# Patient Record
Sex: Female | Born: 1955 | Race: White | Hispanic: No | Marital: Married | Smoking: Never smoker
Health system: Southern US, Community
[De-identification: ages and names within clinical notes are randomized; demographics above are authoritative.]

---

## 1997-07-17 ENCOUNTER — Other Ambulatory Visit: Admission: RE | Admit: 1997-07-17 | Discharge: 1997-07-17 | Payer: Self-pay | Admitting: Obstetrics and Gynecology

## 2000-11-04 ENCOUNTER — Other Ambulatory Visit: Admission: RE | Admit: 2000-11-04 | Discharge: 2000-11-04 | Payer: Self-pay | Admitting: Family Medicine

## 2011-11-26 ENCOUNTER — Emergency Department (HOSPITAL_COMMUNITY)
Admission: EM | Admit: 2011-11-26 | Discharge: 2011-11-26 | Disposition: A | Discharge status: Home / Self Care | Payer: Self-pay | Attending: Emergency Medicine | Admitting: Emergency Medicine

## 2011-11-26 ENCOUNTER — Emergency Department (HOSPITAL_COMMUNITY): Payer: Self-pay

## 2011-11-26 DIAGNOSIS — M79643 Pain in unspecified hand: Secondary | ICD-10-CM

## 2011-11-26 DIAGNOSIS — M79609 Pain in unspecified limb: Secondary | ICD-10-CM | POA: Insufficient documentation

## 2011-11-26 DIAGNOSIS — Z885 Allergy status to narcotic agent status: Secondary | ICD-10-CM | POA: Insufficient documentation

## 2011-11-26 MED ORDER — IBUPROFEN 600 MG PO TABS: 600.0000 mg | ORAL_TABLET | Freq: Four times a day (QID) | ORAL | Status: AC | PRN

## 2011-11-26 NOTE — ED Notes (Addendum)
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

## 2011-11-26 NOTE — ED Provider Notes (Signed)
Medical screening examination/treatment/procedure(s) were performed by non-physician practitioner and as supervising physician I was immediately available for consultation/collaboration.   Charles B. Bernette Mayers, MD 11/26/11 1622

## 2011-11-26 NOTE — ED Provider Notes (Signed)
History     CSN: [id number]  Arrival date & time 11/26/11  1404   First MD Initiated Contact with Patient 11/26/11 1548      Chief Complaint  Patient presents with  . Hand Injury    (Consider location/radiation/quality/duration/timing/severity/associated sxs/prior treatment) HPI Comments: D17-year-old female presents to the emergency department with continuing right hand pain after slamming her hand into a car door 3 months ago. States she thought the pain which is go away with time, but anytime she uses her hand her pain comes back. 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 Describes the pain as throbbing with occasional sharp sensations. Denies any numbness or tingling in her hand. Denies any decreased range of motion. She takes one 200 mg ibuprofen in the morning which is no longer helping. She has not iced. Denies any other injury to her right hand. Denies any swelling, wounds, or color change.  Patient is a 56 y.o. female presenting with hand injury. The history is provided by the patient.  Hand Injury     No past medical history on file.  No past surgical history on file.  No family history on file.  History  Substance Use Topics  . Smoking status: Not on file  . Smokeless tobacco: Not on file  . Alcohol Use: Not on file    OB History    No data available      Review of Systems  Musculoskeletal: Positive for arthralgias. Negative for joint swelling.  Skin: Negative for color change and wound.  Neurological: Negative for weakness and numbness.    Allergies  Codeine  Home Medications   Current Outpatient Rx  Name Route Sig Dispense Refill  . B COMPLEX-C PO TABS Oral Take 1 tablet by mouth daily.    . IBUPROFEN 200 MG PO TABS Oral Take 200 mg by mouth every 8 (eight) hours as needed. For pain.    . ST JOHNS WORT PO Oral Take 2 tablets by mouth daily.    Marland Kitchen VITAMIN B-12 1000 MCG PO  TABS Oral Take 1,000 mcg by mouth daily.      There were no vitals taken for this visit.  Physical Exam  Nursing note and vitals reviewed. Constitutional: She is oriented to person, place, and time. She appears well-developed and well-nourished. No distress.  HENT:  Head: Normocephalic and atraumatic.  Eyes: Conjunctivae are normal.  Neck: Normal range of motion. Neck supple.  Cardiovascular: Normal rate, regular rhythm and normal heart sounds.   Pulses:      Radial pulses are 2+ on the right side, and 2+ on the left side.  Pulmonary/Chest: Effort normal and breath sounds normal.  Musculoskeletal:       Right wrist: Normal.       Right hand: She exhibits tenderness (first mcp joint). She exhibits normal range of motion, normal capillary refill, no deformity, no laceration and no swelling. normal sensation noted. Normal strength noted.  Neurological: She is alert and oriented to person, place, and time. She has normal strength. No sensory deficit.  Skin: Skin is warm, dry and intact. No bruising, no ecchymosis and no laceration noted. No cyanosis or erythema.  Psychiatric: She has a normal mood and affect. Her behavior is normal.    ED Course  Procedures (including critical care time)  Labs Reviewed - No data to display Dg Hand Complete Right  11/26/2011  *RADIOLOGY  REPORT*  Clinical Data: Pain, injury  RIGHT HAND - COMPLETE 3+ VIEW  Comparison: None.  Findings: Diffuse osteopenia noted.  Mild degenerative changes of the right first [facility] joint at the wrist.  Normal alignment.  No fracture.  IMPRESSION: No acute osseous finding.   Original Report Authenticated By: Judie Petit. Ruel Favors, M.D.      1. Hand pain       MDM  56 year old female with continuing right hand pain for the past 3 months after slamming it in a car door. No acute findings seen on x-ray. Patient's hand is neurovascularly intact with normal strength and range of motion. Discussed a flareup of her arthritis. Thighs use  of splint, ice, ibuprofen 600 mg every 6 hours as needed. Will give resource list for followup.        Trevor Mace, PA-C 11/26/11 1621

## 2013-09-04 IMAGING — CR DG HAND COMPLETE 3+V*R*
3 series · 3 of 3 positions shown · non-contrast
Comparison: None.

CLINICAL DATA: Pain, injury

RIGHT HAND - COMPLETE 3+ VIEW

[x hand pa right]
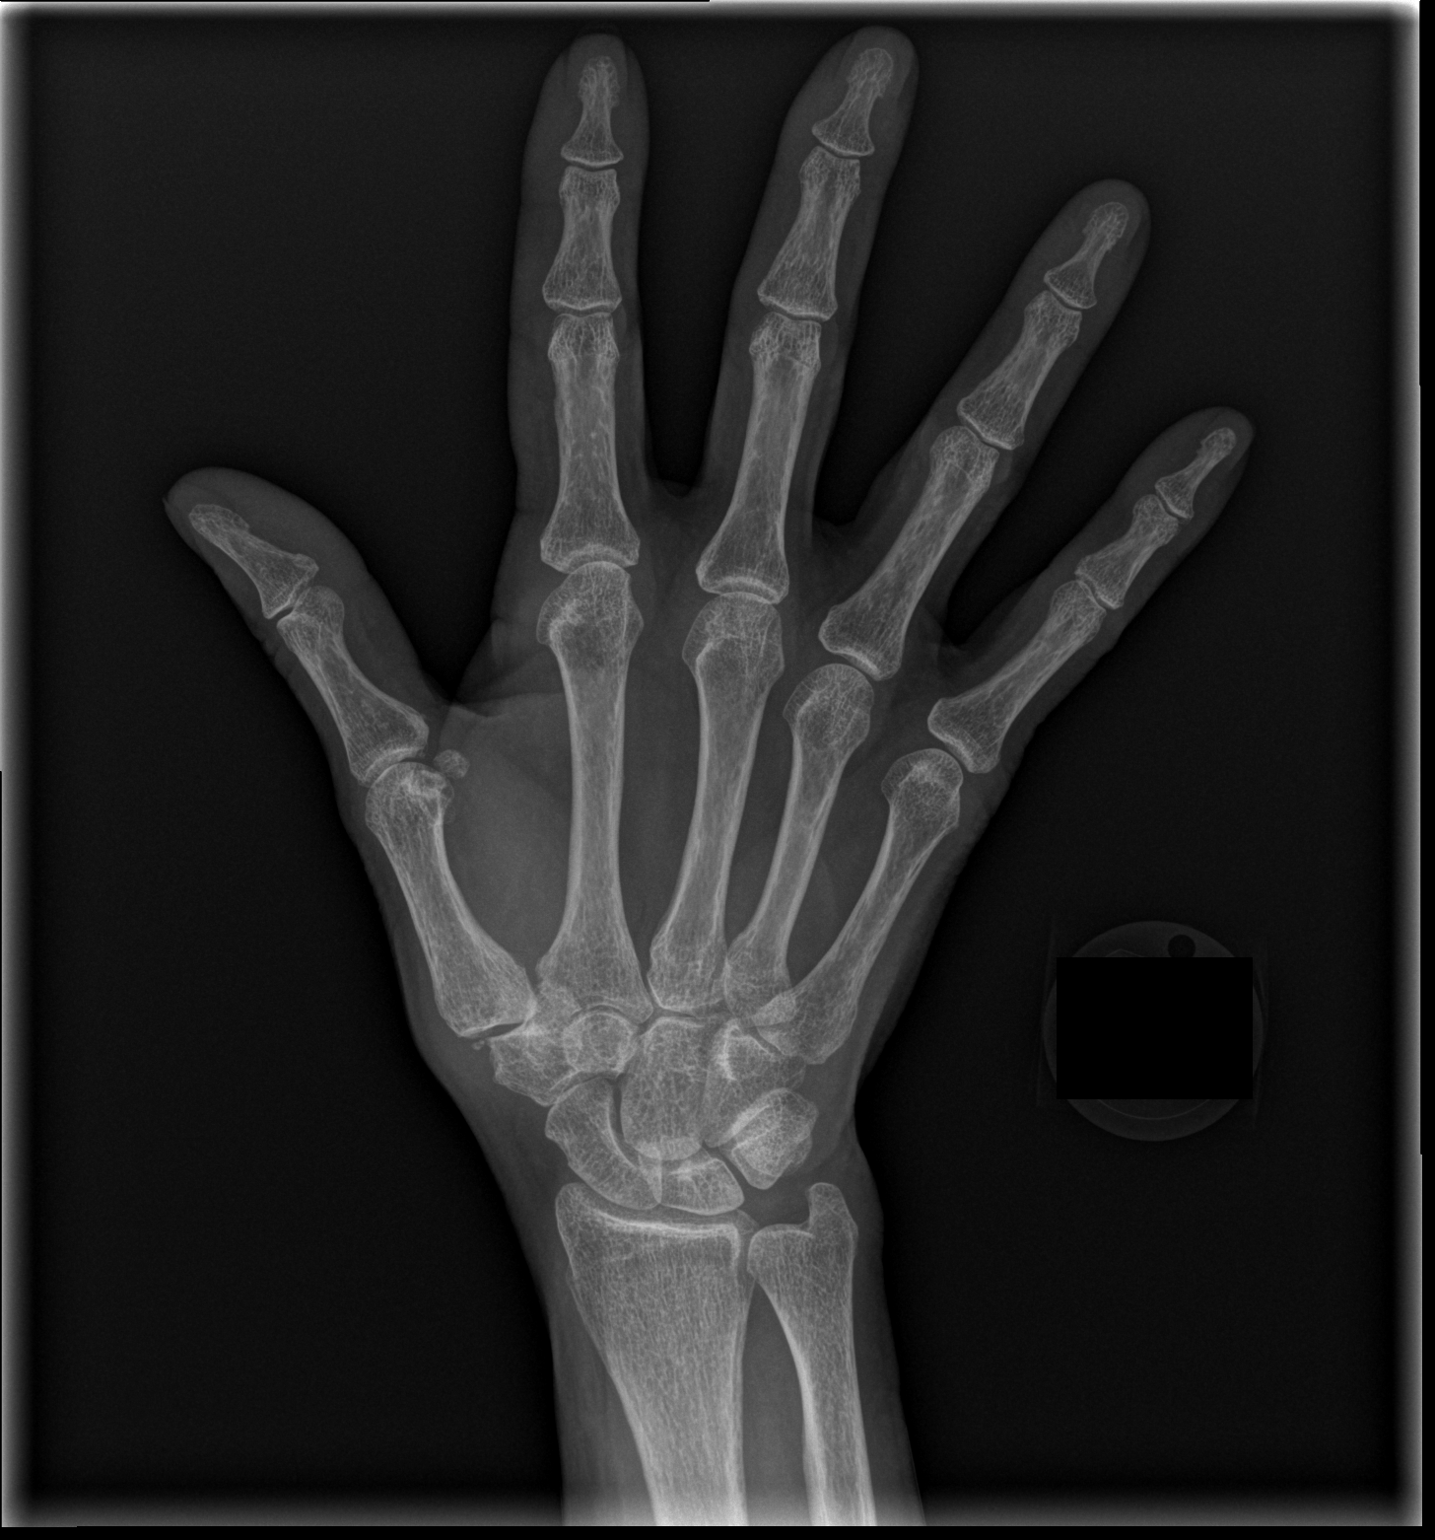

[x hand lat right (1 of 2)]
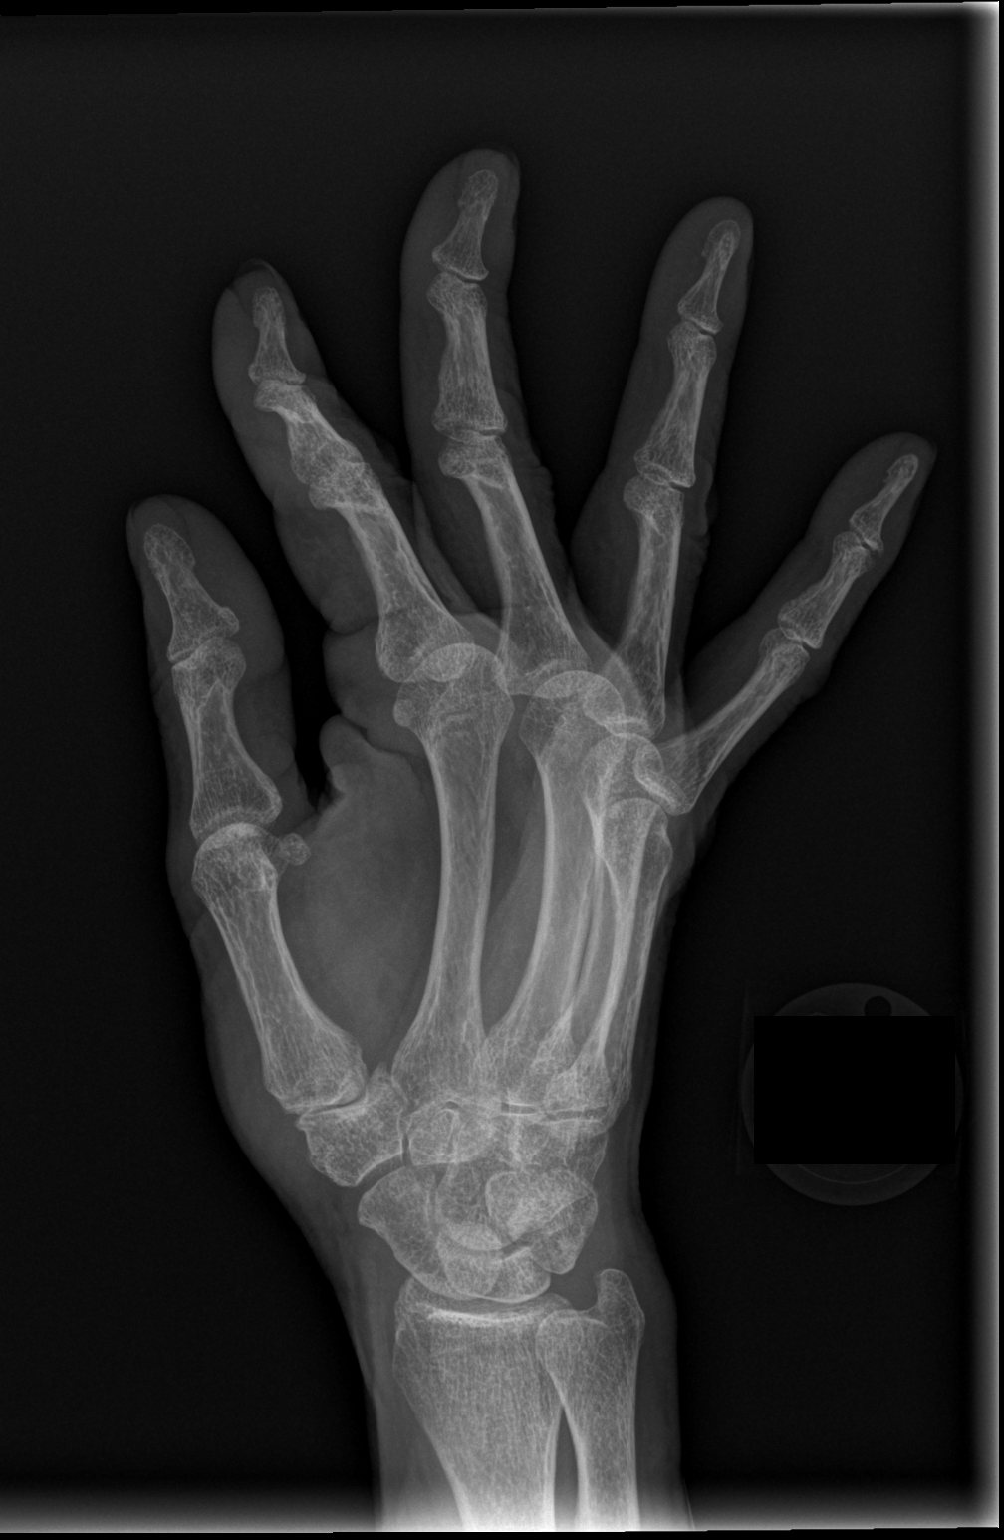

[x hand lat right (2 of 2)]
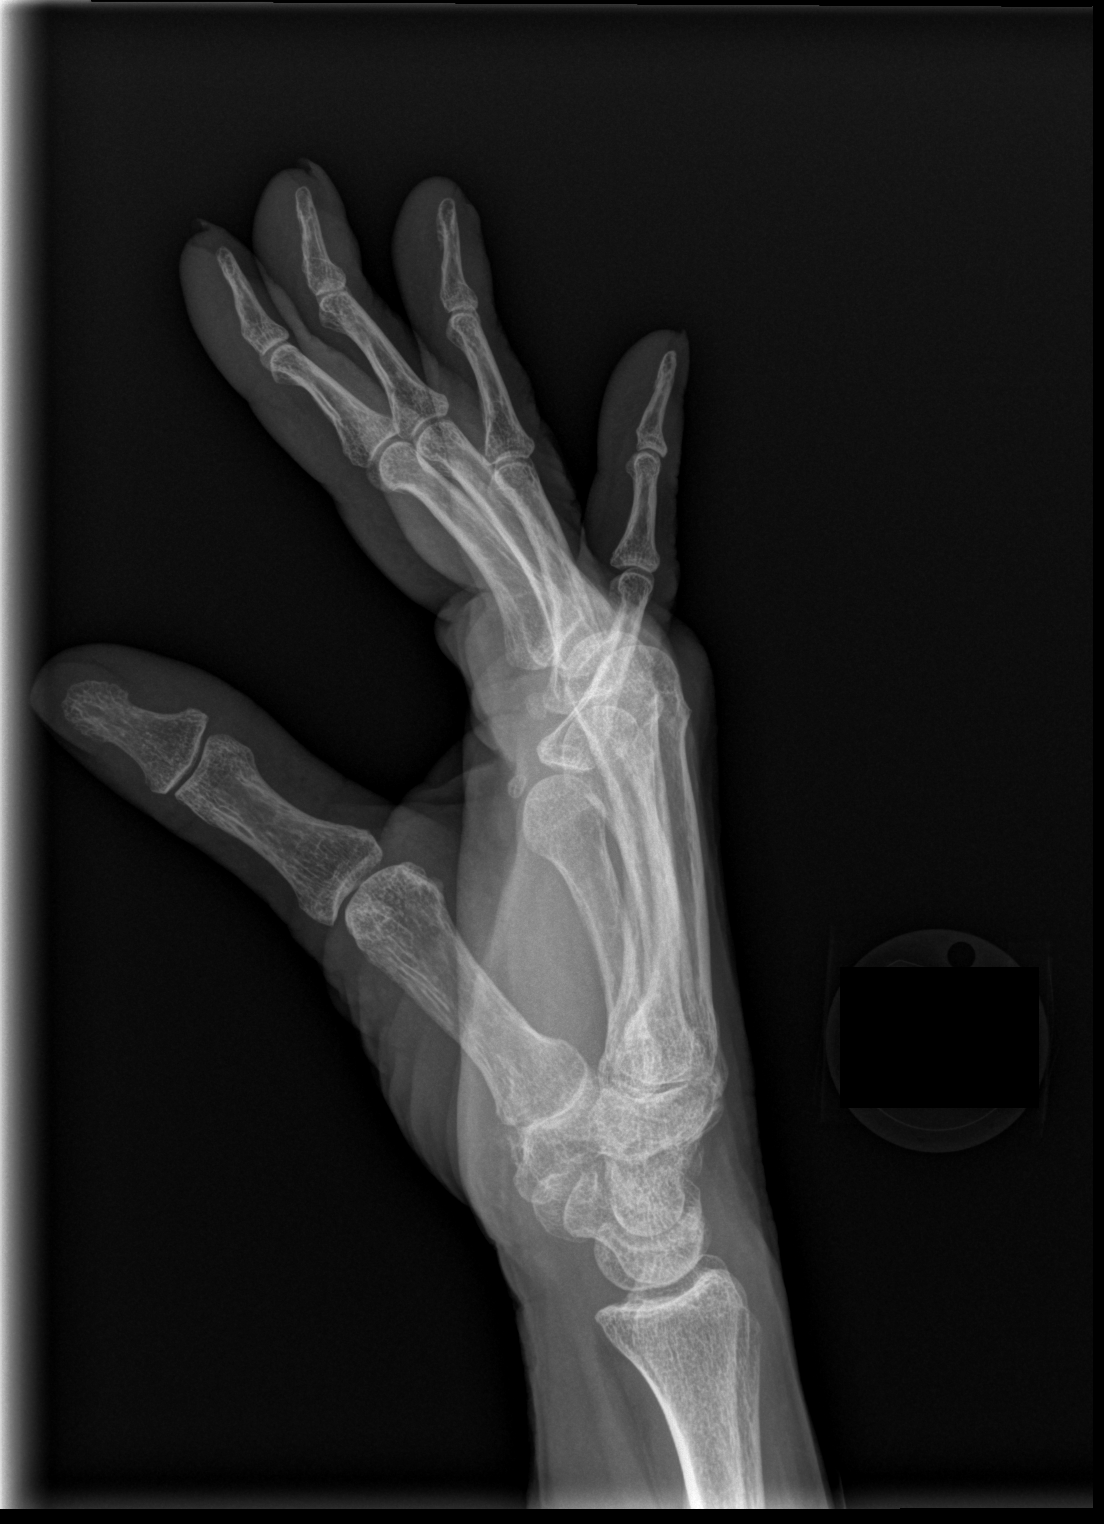

[3 of 3 positions shown; findings below may reference images not displayed]

FINDINGS: Diffuse osteopenia noted.  Mild degenerative changes of
the right first [facility] joint at the wrist.  Normal alignment.  No
fracture.
IMPRESSION: No acute osseous finding.

## 2014-07-08 ENCOUNTER — Emergency Department (HOSPITAL_COMMUNITY)
Admission: EM | Admit: 2014-07-08 | Discharge: 2014-07-08 | Disposition: A | Discharge status: Home / Self Care | Payer: Self-pay | Attending: Emergency Medicine | Admitting: Emergency Medicine

## 2014-07-08 ENCOUNTER — Encounter (HOSPITAL_COMMUNITY): Payer: Self-pay | Admitting: Emergency Medicine

## 2014-07-08 DIAGNOSIS — T6591XA Toxic effect of unspecified substance, accidental (unintentional), initial encounter: Secondary | ICD-10-CM

## 2014-07-08 DIAGNOSIS — Z79899 Other long term (current) drug therapy: Secondary | ICD-10-CM | POA: Insufficient documentation

## 2014-07-08 DIAGNOSIS — Y9389 Activity, other specified: Secondary | ICD-10-CM | POA: Insufficient documentation

## 2014-07-08 DIAGNOSIS — Y990 Civilian activity done for income or pay: Secondary | ICD-10-CM | POA: Insufficient documentation

## 2014-07-08 DIAGNOSIS — X58XXXA Exposure to other specified factors, initial encounter: Secondary | ICD-10-CM | POA: Insufficient documentation

## 2014-07-08 DIAGNOSIS — Y9289 Other specified places as the place of occurrence of the external cause: Secondary | ICD-10-CM | POA: Insufficient documentation

## 2014-07-08 DIAGNOSIS — Z7982 Long term (current) use of aspirin: Secondary | ICD-10-CM | POA: Insufficient documentation

## 2014-07-08 DIAGNOSIS — T550X1A Toxic effect of soaps, accidental (unintentional), initial encounter: Secondary | ICD-10-CM | POA: Insufficient documentation

## 2014-07-08 NOTE — ED Notes (Signed)
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 Pt sts that while taking care of her patient she set her drink down. When she came back to her drink pt sts "there was another woman there." Pt began to drink her drink and noticed it tasted "funny." Pt sts that her throat started to go numb. Pt poured our her drink and noticed a thick blue slime in the bottom of it, pt sts "It looked like dish detergent, but I don't use the blue stuff and neither does my patient."  Pt A&Ox4, sts she was nauseous at first but her only remaining symptom at this time is throat numbness. Pt speaking in complete sentences. Denies SOB, chest pain, N/V, Diarrhea, abdominal pain.

## 2014-07-08 NOTE — ED Provider Notes (Signed)
CSN: [id number]     Arrival date & time 07/08/14  1103 History   First MD Initiated Contact with Patient 07/08/14 1143     Chief Complaint  Patient presents with  . Numbness    Throat     HPI Patient presents to the emergency department because of a possible ingestion of something abnormal.  She states she was at work and had filled her coffee cup with diet Coke.  When she went back to drink it she stated it tasted abnormal and she found some sort of slimy material in it.  She brought with her and it smells like soap and was washed out at the bedside sink by myself.  When washed out at the sink it created soapsuds and smelled like simple green or some other type of cleaning substance.  Patient reports initially she had a mild sore throat with some numbness after drinking about feels fine now.  Patient is without complaints now.   History reviewed. No pertinent past medical history. History reviewed. No pertinent past surgical history. No family history on file. History  Substance Use Topics  . Smoking status: Never Smoker   . Smokeless tobacco: Not on file  . Alcohol Use: Yes   OB History    No data available     Review of Systems  All other systems reviewed and are negative.     Allergies  Codeine  Home Medications   Prior to Admission medications   Medication Sig Start Date End Date Taking? Authorizing Provider  aspirin EC 81 MG tablet Take 81 mg by mouth daily.   Yes Historical Provider, MD  ibuprofen (ADVIL,MOTRIN) 200 MG tablet Take 200 mg by mouth every 8 (eight) hours as needed. For pain.   Yes Historical Provider, MD  OVER THE COUNTER MEDICATION Take 1 tablet by mouth daily. Hydroxycut   Yes Historical Provider, MD  ST JOHNS WORT PO Take 2 tablets by mouth daily.   Yes Historical Provider, MD  vitamin B-12 (CYANOCOBALAMIN) 1000 MCG tablet Take 3,000 mcg by mouth daily.    Yes Historical Provider, MD   BP 208/92 mmHg  Pulse 110  Temp(Src) 97.9 F (36.6 C) (Oral)   Resp 16  SpO2 100% Physical Exam  Constitutional: She is oriented to person, place, and time. She appears well-developed and well-nourished.  HENT:  Head: Normocephalic.  Posterior pharynx is normal  Eyes: EOM are normal.  Neck: Normal range of motion.  Pulmonary/Chest: Effort normal.  Abdominal: She exhibits no distension.  Musculoskeletal: Normal range of motion.  Neurological: She is alert and oriented to person, place, and time.  Psychiatric: She has a normal mood and affect.  Nursing note and vitals reviewed.   ED Course  Procedures (including critical care time) Labs Review Labs Reviewed - No data to display  Imaging Review No results found.   EKG Interpretation None      MDM   Final diagnoses:  Accidental ingestion of substance, initial encounter    Patient is overall well-appearing.  This a nontoxic ingestion.  Discharge home in good condition.  A symptomatically this time.   Azalia BilisKevin Rashema Seawright, MD 07/08/14 [phone]

## 2023-03-24 DEATH — deceased
